# Patient Record
Sex: Female | Born: 2000 | Race: Black or African American | Hispanic: No | Marital: Single | Smoking: Never smoker
Health system: Southern US, Community
[De-identification: ages and names within clinical notes are randomized; demographics above are authoritative.]

## PROBLEM LIST (undated history)

## (undated) DIAGNOSIS — A6000 Herpesviral infection of urogenital system, unspecified: Secondary | ICD-10-CM

## (undated) DIAGNOSIS — T8859XA Other complications of anesthesia, initial encounter: Secondary | ICD-10-CM

## (undated) DIAGNOSIS — I351 Nonrheumatic aortic (valve) insufficiency: Secondary | ICD-10-CM

## (undated) DIAGNOSIS — F909 Attention-deficit hyperactivity disorder, unspecified type: Secondary | ICD-10-CM

## (undated) DIAGNOSIS — F959 Tic disorder, unspecified: Secondary | ICD-10-CM

## (undated) DIAGNOSIS — I020 Rheumatic chorea with heart involvement: Secondary | ICD-10-CM

## (undated) HISTORY — DX: Other complications of anesthesia, initial encounter: T88.59XA

## (undated) HISTORY — DX: Tic disorder, unspecified: F95.9

## (undated) HISTORY — DX: Nonrheumatic aortic (valve) insufficiency: I35.1

## (undated) HISTORY — DX: Rheumatic chorea with heart involvement: I02.0

## (undated) HISTORY — DX: Attention-deficit hyperactivity disorder, unspecified type: F90.9

## (undated) HISTORY — DX: Herpesviral infection of urogenital system, unspecified: A60.00

---
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
## 2019-01-18 ENCOUNTER — Ambulatory Visit (HOSPITAL_COMMUNITY)
Admission: EM | Admit: 2019-01-18 | Discharge: 2019-01-18 | Disposition: A | Discharge status: Home / Self Care | Payer: Medicaid - Out of State | Attending: Family Medicine | Admitting: Family Medicine

## 2019-01-18 ENCOUNTER — Encounter (HOSPITAL_COMMUNITY): Payer: Self-pay

## 2019-01-18 DIAGNOSIS — N898 Other specified noninflammatory disorders of vagina: Secondary | ICD-10-CM | POA: Diagnosis not present

## 2019-01-18 DIAGNOSIS — Z3202 Encounter for pregnancy test, result negative: Secondary | ICD-10-CM | POA: Diagnosis not present

## 2019-01-18 LAB — POCT URINALYSIS DIP (DEVICE)
Bilirubin Urine: NEGATIVE
Glucose, UA: NEGATIVE mg/dL
Hgb urine dipstick: NEGATIVE
Ketones, ur: NEGATIVE mg/dL
Leukocytes,Ua: NEGATIVE
Nitrite: NEGATIVE
Protein, ur: NEGATIVE mg/dL
Specific Gravity, Urine: >=1.03 (ref 1.005–1.030)
Urobilinogen, UA: 0.2 mg/dL (ref 0.0–1.0)
pH: 6 (ref 5.0–8.0)

## 2019-01-18 LAB — POCT PREGNANCY, URINE: Preg Test, Ur: NEGATIVE

## 2019-01-18 MED ORDER — CEFTRIAXONE SODIUM 250 MG IJ SOLR
INTRAMUSCULAR | Status: AC
  Filled 2019-01-18: qty 250

## 2019-01-18 MED ORDER — AZITHROMYCIN 250 MG PO TABS
ORAL_TABLET | ORAL | Status: AC
  Filled 2019-01-18: qty 4

## 2019-01-18 MED ORDER — CEFTRIAXONE SODIUM 250 MG IJ SOLR
250.0000 mg | Freq: Once | INTRAMUSCULAR | Status: AC
  Administered 2019-01-18: 250 mg via INTRAMUSCULAR

## 2019-01-18 MED ORDER — AZITHROMYCIN 250 MG PO TABS
1000.0000 mg | ORAL_TABLET | Freq: Once | ORAL | Status: AC
  Administered 2019-01-18: 1000 mg via ORAL

## 2019-01-18 NOTE — ED Provider Notes (Signed)
[facility] CARE CENTER   [id number] 01/18/19 Arrival Time: 1017  ASSESSMENT & PLAN:  1. Vaginal discharge     Declines HIV/RPR testing. No s/s of PID. U/A normal.   Discharge Instructions     You have been given the following medications today for treatment of suspected gonorrhea and/or chlamydia:  cefTRIAXone (ROCEPHIN) injection 250 mg azithromycin (ZITHROMAX) tablet 1,000 mg  Even though we have treated you today, we have sent testing for sexually transmitted infections. We will notify you of any positive results once they are received. If required, we will prescribe any medications you might need.  Please refrain from all sexual activity for at least the next seven days.     Pending: Labs Reviewed  POC URINE PREG, ED  POCT URINALYSIS DIP (DEVICE)  POCT PREGNANCY, URINE  CERVICOVAGINAL ANCILLARY ONLY    Will notify of any positive results. Instructed to refrain from sexual activity for at least seven days.  Reviewed expectations re: course of current medical issues. Questions answered. Outlined signs and symptoms indicating need for more acute intervention. Patient verbalized understanding. After Visit Summary given.   SUBJECTIVE:  Judy Mason is a 18 y.o. female who presents with complaint of vaginal discharge. Onset gradual. First noticed 2-3 d ago. Describes discharge as thin and white. Denies: urinary frequency, hematuria, urinary hesitancy, urinary retention, chills and sweats. Afebrile. No abdominal or pelvic pain. Normal PO intake wihout n/v. No rashes or lesions. Reports that she is sexually active with single female partner without regular condom use. OTC treatment: none. History of STI: "think so"; questions how long ago.  ROS: As per HPI. All other systems negative.   OBJECTIVE:  Vitals:   01/18/19 1105  BP: (!) 130/91  Pulse: 98  Resp: 16  Temp: 98.3 F (36.8 C)  TempSrc: Oral  SpO2: 97%     General appearance: alert, cooperative,  appears stated age and no distress Throat: lips, mucosa, and tongue normal; teeth and gums normal CV: RRR Lungs: CTAB Back: no CVA tenderness; FROM at waist Abdomen: soft, non-tender GU: deferred Skin: warm and dry Psychological: alert and cooperative; normal mood and affect.  Results for orders placed or performed during the hospital encounter of 01/18/19  POCT urinalysis dip (device)  Result Value Ref Range   Glucose, UA NEGATIVE NEGATIVE mg/dL   Bilirubin Urine NEGATIVE NEGATIVE   Ketones, ur NEGATIVE NEGATIVE mg/dL   Specific Gravity, Urine >=1.030 1.005 - 1.030   Hgb urine dipstick NEGATIVE NEGATIVE   pH 6.0 5.0 - 8.0   Protein, ur NEGATIVE NEGATIVE mg/dL   Urobilinogen, UA 0.2 0.0 - 1.0 mg/dL   Nitrite NEGATIVE NEGATIVE   Leukocytes,Ua NEGATIVE NEGATIVE  Pregnancy, urine POC  Result Value Ref Range   Preg Test, Ur NEGATIVE NEGATIVE    Labs Reviewed  POC URINE PREG, ED  POCT URINALYSIS DIP (DEVICE)  POCT PREGNANCY, URINE  CERVICOVAGINAL ANCILLARY ONLY    No Known Allergies   Family History  Problem Relation Age of Onset  . Healthy Mother   . Healthy Father    Social History   Socioeconomic History  . Marital status: Single    Spouse name: Not on file  . Number of children: Not on file  . Years of education: Not on file  . Highest education level: Not on file  Occupational History  . Not on file  Social Needs  . Financial resource strain: Not on file  . Food insecurity    Worry: Not on file  Inability: Not on file  . Transportation needs    Medical: Not on file    Non-medical: Not on file  Tobacco Use  . Smoking status: Never Smoker  . Smokeless tobacco: Never Used  Substance and Sexual Activity  . Alcohol use: Not on file  . Drug use: Not on file  . Sexual activity: Not on file  Lifestyle  . Physical activity    Days per week: Not on file    Minutes per session: Not on file  . Stress: Not on file  Relationships  . Social Product manager on phone: Not on file    Gets together: Not on file    Attends religious service: Not on file    Active member of club or organization: Not on file    Attends meetings of clubs or organizations: Not on file    Relationship status: Not on file  . Intimate partner violence    Fear of current or ex partner: Not on file    Emotionally abused: Not on file    Physically abused: Not on file    Forced sexual activity: Not on file  Other Topics Concern  . Not on file  Social History Narrative  . Not on file          Vanessa Kick, MD 01/18/19 1123

## 2019-01-18 NOTE — ED Triage Notes (Signed)
Patient report she is having white vaginal discharged after she had unprotected sexual intercourse 2 weeks ago.

## 2019-01-18 NOTE — Discharge Instructions (Signed)

## 2019-01-19 LAB — CERVICOVAGINAL ANCILLARY ONLY
Bacterial Vaginitis (gardnerella): POSITIVE — AB
Candida Glabrata: NEGATIVE
Candida Vaginitis: POSITIVE — AB
Chlamydia: NEGATIVE
Molecular Disclaimer: NEGATIVE
Molecular Disclaimer: NEGATIVE
Molecular Disclaimer: NEGATIVE
Molecular Disclaimer: NEGATIVE
Molecular Disclaimer: NORMAL
Molecular Disclaimer: NORMAL
Neisseria Gonorrhea: NEGATIVE
Trichomonas: NEGATIVE

## 2019-01-20 ENCOUNTER — Telehealth (HOSPITAL_COMMUNITY): Payer: Self-pay | Admitting: Emergency Medicine

## 2019-01-20 MED ORDER — FLUCONAZOLE 150 MG PO TABS: 150.0000 mg | ORAL_TABLET | Freq: Once | ORAL | 0 refills | Status: AC

## 2019-01-20 MED ORDER — METRONIDAZOLE 500 MG PO TABS: 500.0000 mg | ORAL_TABLET | Freq: Two times a day (BID) | ORAL | 0 refills | Status: AC

## 2019-01-20 NOTE — Telephone Encounter (Signed)
Bacterial vaginosis is positive. This was not treated at the urgent care visit.  Flagyl 500 mg BID x 7 days #14 no refills sent to patients pharmacy of choice.    Test for candida (yeast) was positive.  Prescription for fluconazole 150mg po now, repeat dose in 3d if needed, #2 no refills, sent to the pharmacy of record.  Recheck or followup with PCP for further evaluation if symptoms are not improving.    Patient contacted and made aware of    results, all questions answered   

## 2021-12-23 ENCOUNTER — Telehealth: Payer: Self-pay | Admitting: Family Medicine

## 2021-12-23 NOTE — Telephone Encounter (Signed)
Patient would like a RX for, nausea

## 2021-12-24 NOTE — Telephone Encounter (Signed)
Attempted to call pt. No voicemail set up

## 2021-12-25 NOTE — Telephone Encounter (Signed)
Called pt; call cannot be completed. Pt may follow up at nurse visit tomorrow.

## 2021-12-26 ENCOUNTER — Ambulatory Visit (INDEPENDENT_AMBULATORY_CARE_PROVIDER_SITE_OTHER): Payer: Medicaid - Out of State | Admitting: Family Medicine

## 2021-12-26 ENCOUNTER — Other Ambulatory Visit: Payer: Self-pay

## 2021-12-26 ENCOUNTER — Encounter: Payer: Self-pay | Admitting: Family Medicine

## 2021-12-26 VITALS — BP 115/79 | HR 98 | Ht 63.0 in | Wt 250.6 lb

## 2021-12-26 DIAGNOSIS — O219 Vomiting of pregnancy, unspecified: Secondary | ICD-10-CM

## 2021-12-26 DIAGNOSIS — Z3201 Encounter for pregnancy test, result positive: Secondary | ICD-10-CM | POA: Diagnosis not present

## 2021-12-26 LAB — POCT PREGNANCY, URINE: Preg Test, Ur: POSITIVE — AB

## 2021-12-26 MED ORDER — PROMETHAZINE HCL 25 MG PO TABS: 25.0000 mg | ORAL_TABLET | Freq: Four times a day (QID) | ORAL | 0 refills | Status: DC | PRN

## 2021-12-26 MED ORDER — PRENATAL VITAMIN 27-0.8 MG PO TABS: 1.0000 | ORAL_TABLET | Freq: Every day | ORAL | 11 refills | Status: AC

## 2021-12-26 NOTE — Progress Notes (Signed)
  History:  [name] is a 21 y.o. No obstetric history on file. who presents to clinic today with complaint of possible pregnancy.   +home upt Unsure of date of last LMP Had last baby at [facility] in [place], [place] Cesarean after 3 days of IOL for post dates Was not told of any problems at that time Endorses significant nausea  No past medical history on file.  No past surgical history on file.  The following portions of the patient's history were reviewed and updated as appropriate: allergies, current medications, past family history, past medical history, past social history, past surgical history and problem list.   Review of Systems:  Pertinent items noted in HPI and remainder of comprehensive ROS otherwise negative.  Objective:  Physical Exam BP 115/79   Pulse 98   Ht 5\' 3"  (1.6 m)   Wt 250 lb 9.6 oz (113.7 kg)   BMI 44.39 kg/m  Physical Exam Vitals reviewed.  Constitutional:      General: She is not in acute distress.    Appearance: She is well-developed. She is not diaphoretic.  Eyes:     General: No scleral icterus. Pulmonary:     Effort: Pulmonary effort is normal. No respiratory distress.  Skin:    General: Skin is warm and dry.  Neurological:     Mental Status: She is alert.     Coordination: Coordination normal.      Labs and Imaging Results for orders placed or performed in visit on 12/26/21 (from the past 24 hour(s))  Pregnancy, urine POC     Status: Abnormal   Collection Time: 12/26/21  8:50 AM  Result Value Ref Range   Preg Test, Ur POSITIVE (A) NEGATIVE    No results found.   Assessment & Plan:  1. Positive pregnancy test Unplanned but welcome pregnancy Will do ROI for op note from 02/25/22 Phenergan sent for nausea Dating [place] ordered given uncertainty over date of LMP To start prenatal care once dates are established - Prenatal Vit-Fe Fumarate-FA (PRENATAL VITAMIN) 27-0.8 MG TABS; Take 1 tablet by mouth daily.  Dispense: 30  tablet; Refill: 11  2. Nausea and vomiting in pregnancy  - promethazine (PHENERGAN) 25 MG tablet; Take 1 tablet (25 mg total) by mouth every 6 (six) hours as needed for nausea or vomiting.  Dispense: 30 tablet; Refill: 0    Approximately 15 minutes of total time was spent with this patient on history taking, coordination of care, education and documentation.   [place], MD 12/26/2021 9:39 AM

## 2021-12-26 NOTE — Progress Notes (Addendum)
Here for pregnancy test which was positive. States  had a period last month near end of month but not sure exactly which date. States not planned pregnancy. Informed her we recommend dating [place] when unsure LMP. Dating [place] scheduled for 01/06/22. Explained we recommend she start prenatal care once she has EDD. Recommend she start PNV. She asked we send in PNV and nausea med RX. Dr. Crissie Reese in to greet patient. RX for PNV and Phenergan sent to pharmacy.  Nancy Fetter

## 2022-01-06 ENCOUNTER — Other Ambulatory Visit: Payer: Self-pay | Admitting: Obstetrics and Gynecology

## 2022-01-06 ENCOUNTER — Other Ambulatory Visit: Payer: Self-pay

## 2022-01-06 ENCOUNTER — Ambulatory Visit (INDEPENDENT_AMBULATORY_CARE_PROVIDER_SITE_OTHER): Payer: Self-pay

## 2022-01-06 ENCOUNTER — Ambulatory Visit (INDEPENDENT_AMBULATORY_CARE_PROVIDER_SITE_OTHER): Payer: Self-pay | Admitting: Advanced Practice Midwife

## 2022-01-06 ENCOUNTER — Encounter: Payer: Self-pay | Admitting: Advanced Practice Midwife

## 2022-01-06 DIAGNOSIS — Z3491 Encounter for supervision of normal pregnancy, unspecified, first trimester: Secondary | ICD-10-CM

## 2022-01-06 DIAGNOSIS — O219 Vomiting of pregnancy, unspecified: Secondary | ICD-10-CM

## 2022-01-06 DIAGNOSIS — Z3490 Encounter for supervision of normal pregnancy, unspecified, unspecified trimester: Secondary | ICD-10-CM

## 2022-01-06 MED ORDER — CONCEPT OB 130-92.4-1 MG PO CAPS: 1.0000 | ORAL_CAPSULE | Freq: Every day | ORAL | 12 refills | Status: DC

## 2022-01-06 NOTE — Progress Notes (Unsigned)
Ultrasounds Results Note  SUBJECTIVE HPI:  [name] is a 21 y.o. G1P0 at unknown GA, unknown LMP who presents to [facility] for dating ultrasound results. The patient denies/reports abdominal pain or vaginal bleeding.   Previous HCG's none  Previous US None     Repeat ultrasound was performed earlier today.   No past medical history on file. No past surgical history on file. Social History   Socioeconomic History   Marital status: Single    Spouse name: Not on file   Number of children: Not on file   Years of education: Not on file   Highest education level: Not on file  Occupational History   Not on file  Tobacco Use   Smoking status: Never   Smokeless tobacco: Never  Substance and Sexual Activity   Alcohol use: Not on file   Drug use: Not on file   Sexual activity: Not on file  Other Topics Concern   Not on file  Social History Narrative   Not on file   Social Determinants of Health   Financial Resource Strain: Not on file  Food Insecurity: Not on file  Transportation Needs: Not on file  Physical Activity: Not on file  Stress: Not on file  Social Connections: Not on file  Intimate Partner Violence: Not on file   Current Outpatient Medications on File Prior to Visit  Medication Sig Dispense Refill   Prenatal Vit-Fe Fumarate-FA (PRENATAL VITAMIN) 27-0.8 MG TABS Take 1 tablet by mouth daily. 30 tablet 11   promethazine (PHENERGAN) 25 MG tablet Take 1 tablet (25 mg total) by mouth every 6 (six) hours as needed for nausea or vomiting. 30 tablet 0   No current facility-administered medications on file prior to visit.   No Known Allergies  I have reviewed patient's Past Medical Hx, Surgical Hx, Family Hx, Social Hx, medications and allergies.   Review of Systems Review of Systems  Constitutional: Negative for fever and chills.  Gastrointestinal: Negative for abdominal pain.  Genitourinary: Negative for vaginal bleeding.   Musculoskeletal: Negative for back pain.  Neurological: Negative for dizziness and weakness.    Physical Exam  There were no vitals taken for this visit.  No LMP recorded. Patient is pregnant. GENERAL: Well-developed, well-nourished female in no acute distress.  HEENT: Normocephalic, atraumatic.   LUNGS: Effort normal ABDOMEN: Deferred HEART: Regular rate  SKIN: Warm, dry and without erythema PSYCH: Normal mood and affect NEURO: Alert and oriented x 4  LAB RESULTS No results found for this or any previous visit (from the past 24 hour(s)).  IMAGING 10.1 week live [place] 1. Intrauterine pregnancy   2. Pregnancy with uncertain dates in first trimester     PLAN Start prenatal care with provider of your choice List of Ob/Gyn providers given. EDD based on today's [place] Patient advised to start/continue taking prenatal vitamins Go to MAU as needed for heavy bleeding, abdominal pain or fever greater than 100.4.  [place], [place] 01/06/2022 11:53 AM

## 2022-01-06 NOTE — Patient Instructions (Addendum)
[place] [facility] [employer] at [facility]             [address], [place], [place] [address] [place] [facility] [employer] [facility] at [place], [place], [place], [place], [address] [phone]  Center for [facility] at [place], [place], [place], [place], [address] [phone]  Center for [facility] at [facility] [address], [place], [place], [place], [address] [phone]  Center for [place] at [facility]                                 [place], [place], [place], [address] [phone]  Center for [facility] at [facility]                                    [address] [place], [place], [address] [place] [facility] [place] at [facility] [address] [place], [place], [place], [address]                              [facility] [place], [place], [place], [place], [address] [phone]  [facility]         Phone: [phone]  [facility]      Phone: [phone]   [facility]      Phone: [place] [facility]-Family Planning         Phone: [phone]   [place] [facility]-Maternity    Phone: [phone]  [place]      Phone: [phone]  [facility]     Phone: [phone]  [facility]        Phone: [phone]  [facility] ([address] [place]) [phone]  [facility]      Phone: [phone]   CenteringPregnancy is a model of care that started 30 years ago and is used in about 600 practices around the [place]. You meet with a group of 8-12 women due around the same time  as you. In [facility] you will have individual time with the provider and meet as a group. There's much more time for discussion and learning. You will actually have much more time with your provider in Centering than in traditional prenatal care.? You will come directly into the [facility] room and will not wait in the lobby so there is no wasted time. You will have 2-hour visits every 4 weeks then every 2 weeks. You will know your [facility] prenatal appointments in advance. In your last month of pregnancy, you may also come in for some individual visits.  Additional appointments can be scheduled if you need more care. Studies have shown that CenteringPregnancy improves birth outcomes. We have seen especially big improvements in fewer Black women delivering babies who are too small or born too early. Visit the website www.centeringhealthcare.org for more information.  

## 2022-02-04 ENCOUNTER — Emergency Department (HOSPITAL_COMMUNITY)
Admission: EM | Admit: 2022-02-04 | Discharge: 2022-02-04 | Disposition: A | Discharge status: Home / Self Care | Payer: Medicaid - Out of State | Attending: Emergency Medicine | Admitting: Emergency Medicine

## 2022-02-04 ENCOUNTER — Encounter (HOSPITAL_COMMUNITY): Payer: Self-pay

## 2022-02-04 ENCOUNTER — Other Ambulatory Visit: Payer: Self-pay

## 2022-02-04 DIAGNOSIS — Z3A01 Less than 8 weeks gestation of pregnancy: Secondary | ICD-10-CM | POA: Insufficient documentation

## 2022-02-04 DIAGNOSIS — Y92410 Unspecified street and highway as the place of occurrence of the external cause: Secondary | ICD-10-CM | POA: Diagnosis not present

## 2022-02-04 DIAGNOSIS — M545 Low back pain, unspecified: Secondary | ICD-10-CM | POA: Insufficient documentation

## 2022-02-04 DIAGNOSIS — O26891 Other specified pregnancy related conditions, first trimester: Secondary | ICD-10-CM | POA: Insufficient documentation

## 2022-02-04 MED ORDER — METRONIDAZOLE 500 MG PO TABS: 500.0000 mg | ORAL_TABLET | Freq: Two times a day (BID) | ORAL | 0 refills | Status: DC

## 2022-02-04 MED ORDER — METRONIDAZOLE 500 MG PO TABS
500.0000 mg | ORAL_TABLET | Freq: Once | ORAL | Status: AC
  Administered 2022-02-04: 500 mg via ORAL
  Filled 2022-02-04: qty 1

## 2022-02-04 MED ORDER — ACETAMINOPHEN 500 MG PO TABS
500.0000 mg | ORAL_TABLET | Freq: Once | ORAL | Status: AC
  Administered 2022-02-04: 500 mg via ORAL
  Filled 2022-02-04: qty 1

## 2022-02-04 NOTE — Discharge Instructions (Signed)
As discussed, your evaluation today has been largely reassuring.  But, it is important that you monitor your condition carefully, and do not hesitate to return to the ED if you develop new, or concerning changes in your condition. ? ?Otherwise, please follow-up with your physician for appropriate ongoing care. ? ?
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
## 2022-02-12 ENCOUNTER — Telehealth (INDEPENDENT_AMBULATORY_CARE_PROVIDER_SITE_OTHER): Payer: Medicaid - Out of State | Admitting: *Deleted

## 2022-02-12 ENCOUNTER — Encounter: Payer: Self-pay | Admitting: *Deleted

## 2022-02-12 DIAGNOSIS — I351 Nonrheumatic aortic (valve) insufficiency: Secondary | ICD-10-CM

## 2022-02-12 DIAGNOSIS — O99340 Other mental disorders complicating pregnancy, unspecified trimester: Secondary | ICD-10-CM

## 2022-02-12 DIAGNOSIS — F32A Depression, unspecified: Secondary | ICD-10-CM

## 2022-02-12 DIAGNOSIS — O99210 Obesity complicating pregnancy, unspecified trimester: Secondary | ICD-10-CM

## 2022-02-12 DIAGNOSIS — Z3490 Encounter for supervision of normal pregnancy, unspecified, unspecified trimester: Secondary | ICD-10-CM

## 2022-02-12 DIAGNOSIS — I020 Rheumatic chorea with heart involvement: Secondary | ICD-10-CM

## 2022-02-12 DIAGNOSIS — F419 Anxiety disorder, unspecified: Secondary | ICD-10-CM

## 2022-02-12 DIAGNOSIS — O0990 Supervision of high risk pregnancy, unspecified, unspecified trimester: Secondary | ICD-10-CM

## 2022-02-12 NOTE — Patient Instructions (Addendum)
  At our [facility], we work as an integrated team, providing care to address both physical and emotional health. Your medical provider may refer you to see our Behavioral Health Clinician (BHC) on the same day you see your medical provider, as availability permits; often scheduled virtually at your convenience.  Our BHC is available to all patients, visits generally last between 20-30 minutes, but can be longer or shorter, depending on patient need. The BHC offers help with stress management, coping with symptoms of depression and anxiety, major life changes , sleep issues, changing risky behavior, grief and loss, life stress, working on personal life goals, and  behavioral health issues, as these all affect your overall health and wellness.  The BHC is NOT available for the following: FMLA paperwork, court-ordered evaluations, specialty assessments (custody or disability), letters to employers, or obtaining certification for an emotional support animal. The BHC does not provide long-term therapy. You have the right to refuse integrated behavioral health services, or to reschedule to see the BHC at a later date.  Confidentiality exception: If it is suspected that a child or disabled adult is being abused or neglected, we are required by law to report that to either Child Protective Services or Adult Protective Services.  If you have a diagnosis of Bipolar affective disorder, Schizophrenia, or recurrent Major depressive disorder, we will recommend that you establish care with a psychiatrist, as these are lifelong, chronic conditions, and we want your overall emotional health and medications to be more closely monitored. If you anticipate needing extended maternity leave due to mental health issues postpartum, it it recommended you inform your medical provider, so we can put in a referral to a psychiatrist as soon as possible. The BHC is unable to recommend an extended maternity leave for mental  health issues. Your medical provider or BHC may refer you to a therapist for ongoing, traditional therapy, or to a psychiatrist, for medication management, if it would benefit your overall health. Depending on your insurance, you may have a copay or be charged a deductible, depending on your insurance, to see the BHC. If you are uninsured, it is recommended that you apply for financial assistance. (Forms may be requested at the front desk for in-person visits, via MyChart, or request a form during a virtual visit).  If you see the BHC more than 6 times, you will have to complete a comprehensive clinical assessment interview with the BHC to resume integrated services.  For virtual visits with the BHC, you must be physically in the state of [place] at the time of the visit. For example, if you live in [place], you will have to do an in-person visit with the BHC, and your out-of-state insurance may not cover behavioral health services in [place]. If you are going out of the state or country for any reason, the BHC may see you virtually when you return to [place], but not while you are physically outside of [place].    [email or web address] is a website with information to help you prepare for Labor and delivery, patient information, visitor information and more.    Here is a link to the Pregnancy Navigators . Please Fill out prior to your New OB appointment.   English Link: [email or web address]  Spanish Link: [email or web address]  
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
## 2022-02-13 ENCOUNTER — Ambulatory Visit: Payer: Self-pay | Admitting: Clinical

## 2022-02-13 DIAGNOSIS — Z658 Other specified problems related to psychosocial circumstances: Secondary | ICD-10-CM

## 2022-02-13 NOTE — Patient Instructions (Signed)
Center for [facility] at [facility] for Women [place], [place] [address] [phone] (main office) [phone] (Megyn Leng's/Madison's office)  Sign up for childbirth classes/pre-register for hospital, etc, at:  [email or web address]   [employer] www.[email or web address]      BRAINSTORMING  Develop a Plan Goals: Provide a way to start conversation about your new life with a baby Assist parents in recognizing and using resources within their reach Help pave the way before birth for an easier period of transition afterwards.  Make a list of the following information to keep in a central location: Full name of Mom and Partner: _____________________________________________ 21 full name and Date of Birth: ___________________________________________ Home Address: ___________________________________________________________ ________________________________________________________________________ Home Phone: ____________________________________________________________ Parents' cell numbers: _____________________________________________________ ________________________________________________________________________ Name and contact info for OB: ______________________________________________ Name and contact info for Pediatrician:________________________________________ Contact info for Lactation Consultants: ________________________________________  REST and SLEEP *You each need at least 4-5 hours of uninterrupted sleep every day. Write specific names and contact information.* How are you going to rest in the postpartum period? While partner's home? When partner returns to work? When you both return to work? Where will your baby sleep? Who is available to help during the day? Evening? Night? Who could move in for a period to help support you? What are some ideas to help you get enough  sleep? __________________________________________________________________________________________________________________________________________________________________________________________________________________________________________ NUTRITIOUS FOOD AND DRINK *Plan for meals before your baby is born so you can have healthy food to eat during the immediate postpartum period.* Who will look after breakfast? Lunch? Dinner? List names and contact information. Brainstorm quick, healthy ideas for each meal. What can you do before baby is born to prepare meals for the postpartum period? How can others help you with meals? Which grocery stores provide online shopping and delivery? Which restaurants offer take-out or delivery options? ______________________________________________________________________________________________________________________________________________________________________________________________________________________________________________________________________________________________________________________________________________________________________________________________________  CARE FOR MOM *It's important that mom is cared for and pampered in the postpartum period. Remember, the most important ways new mothers need care are: sleep, nutrition, gentle exercise, and time off.* Who can come take care of mom during this period? Make a list of people with their contact information. List some activities that make you feel cared for, rested, and energized? Who can make sure you have opportunities to do these things? Does mom have a space of her very own within your home that's just for her? Make a "[facility]" where she can be comfortable, rest, and renew herself  daily. ______________________________________________________________________________________________________________________________________________________________________________________________________________________________________________________________________________________________________________________________________________________________________________________________________    CARE FOR AND FEEDING BABY *Knowledgeable and encouraging people will offer the best support with regard to feeding your baby.* Educate yourself and choose the best feeding option for your baby. Make a list of people who will guide, support, and be a resource for you as your care for and feed your baby. (Friends that have breastfed or are currently breastfeeding, lactation consultants, breastfeeding support groups, etc.) Consider a postpartum doula. (These websites can give you information: dona.org & BuyingShow.es) Seek out local breastfeeding resources like the breastfeeding support group at [employer] or [employer]. ______________________________________________________________________________________________________________________________________________________________________________________________________________________________________________________________________________________________________________________________________________________________________________________________________  Verner Chol AND ERRANDS Who can help with a thorough cleaning before baby is born? Make a list of people who will help with housekeeping and chores, like laundry, light cleaning, dishes, bathrooms, etc. Who can run some errands for you? What can you do to make sure you are stocked with basic supplies before baby is born? Who is going to do the  shopping? ______________________________________________________________________________________________________________________________________________________________________________________________________________________________________________________________________________________________________________________________________________________________________________________________________     Family Adjustment *Nurture yourselves.it helps parents be  more loving and allows for better bonding with their child.* What sorts of things do you and partner enjoy doing together? Which activities help you to connect and strengthen your relationship? Make a list of those things. Make a list of people whom you trust to care for your baby so you can have some time together as a couple. What types of things help partner feel connected to Mom? Make a list. What needs will partner have in order to bond with baby? Other children? Who will care for them when you go into labor and while you are in the hospital? Think about what the needs of your older children might be. Who can help you meet those needs? In what ways are you helping them prepare for bringing baby home? List some specific strategies you have for family adjustment. _______________________________________________________________________________________________________________________________________________________________________________________________________________________________________________________________________________________________________________________________________________  SUPPORT *Someone who can empathize with experiences normalizes your problems and makes them more bearable.* Make a list of other friends, neighbors, and/or co-workers you know with infants (and small children, if applicable) with whom you can connect. Make a list of local or online support groups, mom groups, etc. in which you can be  involved. ______________________________________________________________________________________________________________________________________________________________________________________________________________________________________________________________________________________________________________________________________________________________________________________________________  Childcare Plans Investigate and plan for childcare if mom is returning to work. Talk about mom's concerns about her transition back to work. Talk about partner's concerns regarding this transition.  Mental Health *Your mental health is one of the highest priorities for a pregnant or postpartum mom.* 1 in 5 women experience anxiety and/or depression from the time of conception through the first year after birth. Postpartum Mood Disorders are the #1 complication of pregnancy and childbirth and the suffering experienced by these mothers is not necessary! These illnesses are temporary and respond well to treatment, which often includes self-care, social support, talk therapy, and medication when needed. Women experiencing anxiety and depression often say things like: "I'm supposed to be happy.why do I feel so sad?", "Why can't I snap out of it?", "I'm having thoughts that scare me." There is no need to be embarrassed if you are feeling these symptoms: Overwhelmed, anxious, angry, sad, guilty, irritable, hopeless, exhausted but can't sleep You are NOT alone. You are NOT to blame. With help, you WILL be well. Where can I find help? Medical professionals such as your OB, midwife, gynecologist, family practitioner, primary care provider, pediatrician, or mental health providers; [facility] support groups: Feelings After Birth, Breastfeeding Support Group, Baby and Me Group, and Fit 4 Two exercise classes. You have permission to ask for help. It will confirm your feelings, validate your experiences,  share/learn coping strategies, and gain support and encouragement as you heal. You are important! BRAINSTORM Make a list of local resources, including resources for mom and for partner. Identify support groups. Identify people to call late at night - include names and contact info. Talk with partner about perinatal mood and anxiety disorders. Talk with your OB, midwife, and doula about baby blues and about perinatal mood and anxiety disorders. Talk with your pediatrician about perinatal mood and anxiety disorders.   Support & Sanity Savers   What do you really need?  Basics In preparing for a new baby, many expectant parents spend hours shopping for baby clothes, decorating the nursery, and deciding which car seat to buy. Yet most don't think much about what the reality of parenting a newborn will be like, and what they need to make it through that. So, here is the advice of experienced parents. We know you'll read this,  and think "they're exaggerating, I don't really need that." Just trust [place] on these, OK? Plan for all of this, and if it turns out you don't need it, come back and teach [place] how you did it!  Must-Haves (Once baby's survival needs are met, make sure you attend to your own survival needs!) Sleep An average newborn sleeps 16-18 hours per day, over 6-7 sleep periods, rarely more than three hours at a time. It is normal and healthy for a newborn to wake throughout the night... but really hard on parents!! Naps. Prioritize sleep above any responsibilities like: cleaning house, visiting friends, running errands, etc.  Sleep whenever baby sleeps. If you can't nap, at least have restful times when baby eats. The more rest you get, the more patient you will be, the more emotionally stable, and better at solving problems.  Food You may not have realized it would be difficult to eat when you have a newborn. Yet, when we talk to countless new parents, they say things like "it may be 2:00 pm  when I realize I haven't had breakfast yet." Or "every time we sit down to dinner, baby needs to eat, and my food gets cold, so I don't bother to eat it." Finger food. Before your baby is born, stock up with one months' worth of food that: 1) you can eat with one hand while holding a baby, 2) doesn't need to be prepped, 3) is good hot or cold, 4) doesn't spoil when left out for a few hours, and 5) you like to eat. Think about: nuts, dried fruit, Clif bars, pretzels, jerky, gogurt, baby carrots, apples, bananas, crackers, cheez-n-crackers, string cheese, hot pockets or frozen burritos to microwave, garden burgers and breakfast pastries to put in the toaster, yogurt drinks, etc. Restaurant Menus. Make lists of your favorite restaurants & menu items. When family/friends want to help, you can give specific information without much thought. They can either bring you the food or send gift cards for just the right meals. Freezer Meals.  Take some time to make a few meals to put in the freezer ahead of time.  Easy to freeze meals can be anything such as soup, lasagna, chicken pie, or spaghetti sauce. Set up a Meal Schedule.  Ask friends and family to sign up to bring you meals during the first few weeks of being home. (It can be passed around at baby showers!) You have no idea how helpful this will be until you are in the throes of parenting.  https://hamilton-[email or web address] is a great website to check out. Emotional Support Know who to call when you're stressed out. Parenting a newborn is very challenging work. There are times when it totally overwhelms your normal coping abilities. EVERY NEW PARENT NEEDS TO HAVE A PLAN FOR WHO TO CALL WHEN THEY JUST CAN'T COPE ANY MORE. (And it has to be someone other than the baby's other parent!) Before your baby is born, come up with at least one person you can call for support - write their phone number down and post it on the refrigerator. Anxiety & Sadness. Baby blues are normal after  pregnancy; however, there are more severe types of anxiety & sadness which can occur and should not be ignored.  They are always treatable, but you have to take the first step by reaching out for help. [facility] offers a "Mom Talk" group which meets every Tuesday from 10 am - 11 am.  This group is for new moms who  need support and connection after their babies are born.  Call [phone].  Really, Really Helpful (Plan for them! Make sure these happen often!!) Physical Support with Taking Care of Yourselves Asking friends and family. Before your baby is born, set up a schedule of people who can come and visit and help out (or ask a friend to schedule for you). Any time someone says "let me know what I can do to help," sign them up for a day. When they get there, their job is not to take care of the baby (that's your job and your joy). Their job is to take care of you!  Postpartum doulas. If you don't have anyone you can call on for support, look into postpartum doulas:  professionals at helping parents with caring for baby, caring for themselves, getting breastfeeding started, and helping with household tasks. www.padanc.org is a helpful website for learning about doulas in our area. Peer Support / Parent Groups Why: One of the greatest ideas for new parents is to be around other new parents. Parent groups give you a chance to share and listen to others who are going through the same season of life, get a sense of what is normal infant development by watching several babies learn and grow, share your stories of triumph and struggles with empathetic ears, and forgive your own mistakes when you realize all parents are learning by trial and error. Where to find: There are many places you can meet other new parents throughout our community.  [facility] offers the following classes for new moms and their little ones:  Baby and Me (Birth to Lake) and Breastfeeding Support Group. Go to  www.conehealthybaby.com or call [phone] for more information. Time for your Relationship It's easy to get so caught up in meeting baby's immediate needs that it's hard to find time to connect with your partner, and meet the needs of your relationship. It's also easy to forget what "quality time with your partner" actually looks like. If you take your baby on a date, you'd be amazed how much of your couple time is spent feeding the baby, diapering the baby, admiring the baby, and talking about the baby. Dating: Try to take time for just the two of you. Babysitter tip: Sometimes when moms are breastfeeding a newborn, they find it hard to figure out how to schedule outings around baby's unpredictable feeding schedules. Have the babysitter come for a three hour period. When she comes over, if baby has just eaten, you can leave right away, and come back in two hours. If baby hasn't fed recently, you start the date at home. Once baby gets hungry and gets a good feeding in, you can head out for the rest of your date time. Date Nights at Home: If you can't get out, at least set aside one evening a week to prioritize your relationship: whenever baby dozes off or doesn't have any immediate needs, spend a little time focusing on each other. Potential conflicts: The main relationship conflicts that come up for new parents are: issues related to sexuality, financial stresses, a feeling of an unfair division of household tasks, and conflicts in parenting styles. The more you can work on these issues before baby arrives, the better!  Fun and Frills (Don't forget these. and don't feel guilty for indulging in them!) Everyone has something in life that is a fun little treat that they do just for themselves. It may be: reading the morning paper, or going for a  daily jog, or having coffee with a friend once a week, or going to a movie on Friday nights, or fine chocolates, or bubble baths, or curling up with a good  book. Unless you do fun things for yourself every now and then, it's hard to have the energy for fun with your baby. Whatever your "special" treats are, make sure you find a way to continue to indulge in them after your baby is born. These special moments can recharge you, and allow you to return to baby with a new joy   PERINATAL MOOD DISORDERS: [place]   _________________________________________Emergency and Crisis Resources If you are an imminent risk to self or others, are experiencing intense personal distress, and/or have noticed significant changes in activities of daily living, call:  [place]: [phone]  [address], [place], [place], [address] Mobile Crisis: [place]: [phone] Or visit the following crisis centers: Local Emergency Departments [facility]: [address] [place]. Hours: 8:30AM-5PM. Insurance Accepted: Medicaid, Medicare, and Uninsured.  [facility]:  [address], [place]  Mon-Friday 8am-3pm, [phone]                                                                                  ___________ Non-Crisis Resources To identify specific providers that are covered by your insurance, contact your insurance company or local agencies:  [place]: [phone] [facility] [employer]: [place]: [phone] Postpartum Support International- Warm-line: 813-227-8814                                                      __Outpatient Therapy and Medication Management   Providers:  [facility]: [phone] Hours: 9AM-5PM  Insurance Accepted: Alben Spittle, Shane Crutch, [place], [place] [facility]): [phone] Hours: 8AM-5:30PM  nsurance Accepted: All insurances EXCEPT AARP, [place],  [place], and [place]: [phone] Hours: 8AM-8PM Insurance Accepted: Cristal Ford, Freddrick March, [place], Medicare, [phone] [facility]: [phone] Hours: 8:30AM-7PM Insurance Accepted: Cristal Ford, Medicaid, Medicare, Tricare, [employer] Counseling:  [place] Accepted:  Holland Falling, Lorella Nimrod, [employer], [place]: [phone] Hours: 9AM-5:30PM Insurance Accepted: Alben Spittle, Charlotte Crumb, and Medicaid, Medicare, [facility]:  [phone] Hours: 9am-5pm Insurance Accepted: BCBS; they do not accept Medicaid/Medicare [facility] [place]: [phone] Hours: 9am-9pm Insurance Accepted: All major insurance including Medicaid and Medicare [facility]: [phone] Hours: [place] Accepted: All insurances EXCEPT Medicaid and Medicare. [facility]: [phone]   ____________  Parenting [place]: [phone] [place]:  [place]: (support for children in the NICU and/or with special needs), [phone]   ___________                                                                 Mental Health Support Groups Mental Health Association: [phone]    _____________                                                                                  Online Resources Postpartum Support International: http://jones-berg.[email or web address]/  800-944-4PPD 2Moms Supporting Moms:  www.momssupportingmoms.net

## 2022-02-13 NOTE — BH Specialist Note (Signed)
Integrated Behavioral Health via Telemedicine Visit  02/13/2022 Olinda Diane [id number]  I reviewed patient visit with the [facility] Intern, and I concur with the treatment plan, as documented in the [facility] Intern note.   No charge for this visit due to [facility] Intern seeing patient.  Vesta Mixer, MSW, [place] for [employer] at [facility]   Number of [place] Clinician visits: 1- Initial Visit  Session Start time: [phone]   Session End time: [id number]  Total time in minutes: 29   Referring Provider: Gaylan Gerold, CNM Patient/Family location: Pt located at a family members home  [facility] Provider location: [place] All persons participating in visit: Pt and [facility] Intern Types of Service: Individual psychotherapy  I connected with Gustavo Lah via  Telephone or Video Enabled Telemedicine Application  (Video is Caregility application) and verified that I am speaking with the correct person using two identifiers. Discussed confidentiality: Yes   I discussed the limitations of telemedicine and the availability of in person appointments.  Discussed there is a possibility of technology failure and discussed alternative modes of communication if that failure occurs.  I discussed that engaging in this telemedicine visit, they consent to the provision of behavioral healthcare and the services will be billed under their insurance.  Patient and/or legal guardian expressed understanding and consented to Telemedicine visit: Yes   Presenting Concerns: Patient and/or family reports the following symptoms/concerns: Pt states having concerns about her mood, being upset and anxious most of the time. Pt also anxious about experiencing PPD again like her first pregnancy.  Duration of problem: ongoing; Severity of problem: moderate  Patient and/or Family's Strengths/Protective Factors: Social connections, Sense of  purpose, Physical Health (exercise, healthy diet, medication compliance, etc.), and Parental Resilience  Goals Addressed: Patient will:  Reduce symptoms of: anxiety, mood instability, and stress   Increase knowledge and/or ability of: coping skills, healthy habits, and stress reduction   Demonstrate ability to: Increase healthy adjustment to current life circumstances and Increase adequate support systems for patient/family  Progress towards Goals: Ongoing  Interventions: Interventions utilized:  Solution-Focused Strategies, Supportive Counseling, and Psychoeducation and/or Health Education Standardized Assessments completed: GAD-7 and PHQ 9    02/13/2022   10:28 AM 02/12/2022    2:42 PM  GAD 7 : Generalized Anxiety Score  Nervous, Anxious, on Edge 1 1  Control/stop worrying 1 3  Worry too much - different things 1 3  Trouble relaxing 0 0  Restless 0 0  Easily annoyed or irritable 1 1  Afraid - awful might happen 1 1  Total GAD 7 Score 5 9    [place] from 02/13/2022 in [facility] [place] [facility]  PHQ-9 Total Score 2        Patient and/or Family Response: Patient agrees with treatment plan.   Assessment: Patient currently experiencing stress due to mood instability and lack of access to resources.  Patient may benefit from medication management and connection to community resources. Pt states she is interested in medication management, [facility] Intern will contact CNM Gaylan Gerold to discuss further. Pt stated she received SNAP benefits, WIC and HUD recently and feels more stable. Pt will follow-up with [facility] Intern as needed.   Plan: Follow up with behavioral health clinician on : pt will call [facility] Intern again if she needs to talk. Behavioral recommendations:   - visit the [facility] [email or web address]  - Look  through [place]   [email or web address] --Continue prioritizing healthy self-care (regular meals, adequate rest; allowing practical help from supportive friends and family) until at least postpartum medical appointment Referral(s): [place] (In Clinic)  I discussed the assessment and treatment plan with the patient and/or parent/guardian. They were provided an opportunity to ask questions and all were answered. They agreed with the plan and demonstrated an understanding of the instructions.   They were advised to call back or seek an in-person evaluation if the symptoms worsen or if the condition fails to improve as anticipated.  [address] [place]

## 2022-02-13 NOTE — BH Specialist Note (Signed)
error 

## 2022-02-18 ENCOUNTER — Other Ambulatory Visit: Payer: Medicaid - Out of State

## 2022-02-24 ENCOUNTER — Encounter (HOSPITAL_COMMUNITY): Payer: Self-pay | Admitting: Obstetrics and Gynecology

## 2022-02-24 ENCOUNTER — Inpatient Hospital Stay (HOSPITAL_COMMUNITY)
Admission: AD | Admit: 2022-02-24 | Discharge: 2022-02-24 | Disposition: A | Discharge status: Home / Self Care | Payer: Medicaid - Out of State | Attending: Obstetrics and Gynecology | Admitting: Obstetrics and Gynecology

## 2022-02-24 DIAGNOSIS — L089 Local infection of the skin and subcutaneous tissue, unspecified: Secondary | ICD-10-CM | POA: Diagnosis not present

## 2022-02-24 DIAGNOSIS — O8601 Infection of obstetric surgical wound, superficial incisional site: Secondary | ICD-10-CM | POA: Diagnosis present

## 2022-02-24 DIAGNOSIS — Z3A17 17 weeks gestation of pregnancy: Secondary | ICD-10-CM | POA: Insufficient documentation

## 2022-02-24 DIAGNOSIS — O0990 Supervision of high risk pregnancy, unspecified, unspecified trimester: Secondary | ICD-10-CM

## 2022-02-24 DIAGNOSIS — Z792 Long term (current) use of antibiotics: Secondary | ICD-10-CM | POA: Diagnosis not present

## 2022-02-24 DIAGNOSIS — O900 Disruption of cesarean delivery wound: Secondary | ICD-10-CM | POA: Diagnosis not present

## 2022-02-24 MED ORDER — CEFADROXIL 500 MG PO CAPS: 500.0000 mg | ORAL_CAPSULE | Freq: Two times a day (BID) | ORAL | 0 refills | Status: DC

## 2022-02-24 NOTE — Progress Notes (Signed)
Written and verbal d/c instructions given and understanding voiced. Pt did take written instructions and was interested in info regarding abscess

## 2022-02-24 NOTE — MAU Note (Signed)
Pt stated to CNM she did not want her d/c papers so don't print them out. Pt came by EMS but waiting for a ride in her room.

## 2022-02-24 NOTE — MAU Provider Note (Signed)
Chief Complaint: C/S scar openning   Event Date/Time   First Provider Initiated Contact with Patient 02/24/22 2035        SUBJECTIVE HPI: Judy Mason is a 21 y.o. G3P1011 at 100w1d by LMP who presents to maternity admissions reporting area on center of old C/S scar which opened up and drained fluid.  Had been feeling some cramping and was rubbing abdomen, then felt fluid.. She denies vaginal bleeding, vaginal itching/burning, urinary symptoms, h/a, dizziness, n/v, or fever/chills.    Other This is a new problem. The current episode started today. Pertinent negatives include no abdominal pain, chills, fever or myalgias. Exacerbated by: palpation of area. She has tried nothing for the symptoms.   RN Note: Judy Mason is a 21 y.o. at [redacted]w[redacted]d here in MAU reporting c/s scar openning up. Had c/s 07/2017. Pt states had pain in c/s scar for a wk. Was itchy and sore. Was scratching it earlier and something burst and area started bleeding. Area felt better after that. Is no longer bleeding but just itching. No pain now but just itching of incision  Onset of complaint: 1 hour ago  Pain score: 0  Past Medical History:  Diagnosis Date   ADHD    Complication of anesthesia    itching from epidural   Genital herpes    Mild aortic insufficiency    Sydenham's chorea with heart involvement    Tic disorder    Past Surgical History:  Procedure Laterality Date   CESAREAN SECTION     Social History   Socioeconomic History   Marital status: Single    Spouse name: Not on file   Number of children: Not on file   Years of education: Not on file   Highest education level: Not on file  Occupational History   Not on file  Tobacco Use   Smoking status: Never   Smokeless tobacco: Never  Vaping Use   Vaping Use: Former  Substance and Sexual Activity   Alcohol use: Not Currently    Comment: occasionally   Drug use: Not Currently    Types: Marijuana   Sexual activity: Yes    Birth  control/protection: None  Other Topics Concern   Not on file  Social History Narrative   Not on file   Social Determinants of Health   Financial Resource Strain: Not on file  Food Insecurity: Food Insecurity Present (02/12/2022)   Hunger Vital Sign    Worried About Running Out of Food in the Last Year: Sometimes true    Ran Out of Food in the Last Year: Sometimes true  Transportation Needs: No Transportation Needs (02/12/2022)   PRAPARE - Administrator, Civil Service (Medical): No    Lack of Transportation (Non-Medical): No  Physical Activity: Not on file  Stress: Not on file  Social Connections: Not on file  Intimate Partner Violence: Not on file   No current facility-administered medications on file prior to encounter.   Current Outpatient Medications on File Prior to Encounter  Medication Sig Dispense Refill   Prenatal Vit-Fe Fumarate-FA (PRENATAL VITAMIN) 27-0.8 MG TABS Take 1 tablet by mouth daily. 30 tablet 11   metroNIDAZOLE (FLAGYL) 500 MG tablet Take 1 tablet (500 mg total) by mouth 2 (two) times daily. (Patient not taking: Reported on 02/12/2022) 14 tablet 0   Prenat w/o A Vit-FeFum-FePo-FA (CONCEPT OB) 130-92.4-1 MG CAPS Take 1 tablet by mouth daily. (Patient not taking: Reported on 02/12/2022) 30 capsule 12   promethazine (PHENERGAN)  25 MG tablet Take 1 tablet (25 mg total) by mouth every 6 (six) hours as needed for nausea or vomiting. (Patient not taking: Reported on 02/12/2022) 30 tablet 0   Allergies  Allergen Reactions   Citrus Other (See Comments)    Pineapple - makes mouth itch    I have reviewed patient's Past Medical Hx, Surgical Hx, Family Hx, Social Hx, medications and allergies.   ROS:  Review of Systems  Constitutional:  Negative for chills and fever.  Gastrointestinal:  Negative for abdominal pain.  Musculoskeletal:  Negative for myalgias.   Review of Systems  Other systems negative   Physical Exam  Physical Exam Patient Vitals  for the past 24 hrs:  BP Temp Pulse Resp SpO2 Height Weight  02/24/22 2010 137/75 -- -- -- -- -- --  02/24/22 2009 -- (!) 97.5 F (36.4 C) 87 17 100 % 5\' 2"  (1.575 m) 114.8 kg   Constitutional: Well-developed, well-nourished female in no acute distress.  Cardiovascular: normal rate Respiratory: normal effort GI: Abd soft, non-tender.    There is a small 77mmx2mm area at center of incision (?small keloid) which has an opening in center like a tiny abscess, draining milky fluid.  Tender to touch. No erethema or induration.  MS: Extremities nontender, no edema, normal ROM Neurologic: Alert and oriented x 4.  GU: Neg CVAT.   FHT 167 by doppler  LAB RESULTS No results found for this or any previous visit (from the past 24 hour(s)).   IMAGING No results found.  MAU Management/MDM: I have reviewed the triage vital signs and the nursing notes.   Pertinent labs & imaging results that were available during my care of the patient were reviewed by me and considered in my medical decision making (see chart for details).      I have reviewed her medical records including past results, notes and treatments.   Area dried.  Wound not big enough to irrigate.  Dry dressing applied  Treatments in MAU included dry dressing.    ASSESSMENT Superficial skin infection on old cesarean scar  PLAN Discharge home Rx Duricef to treat possible infection Keep area clean and dry.  Have it rechecked when she goes in for labs. Infection precautions Pt stable at time of discharge. Encouraged to return here if she develops worsening of symptoms, increase in pain, fever, or other concerning symptoms.    Hansel Feinstein CNM, MSN Certified Nurse-Midwife 02/24/2022  8:35 PM

## 2022-02-24 NOTE — MAU Note (Signed)
.  Judy Mason is a 21 y.o. at [redacted]w[redacted]d here in MAU reporting c/s scar openning up. Had c/s 07/2017. Pt states had pain in c/s scar for a wk. Was itchy and sore. Was scratching it earlier and something burst and area started bleeding. Area felt better after that. Is no longer bleeding but just itching. No pain now but just itching of incision  Onset of complaint: 1 hour ago  Pain score: 0 Vitals:   02/24/22 2009 02/24/22 2010  BP:  137/75  Pulse: 87   Resp: 17   Temp: (!) 97.5 F (36.4 C)   SpO2: 100%      FHT:167 Lab orders placed from triage:  none

## 2022-03-09 ENCOUNTER — Ambulatory Visit: Payer: Medicaid - Out of State | Attending: Obstetrics and Gynecology | Admitting: *Deleted

## 2022-03-09 ENCOUNTER — Ambulatory Visit: Payer: Medicaid - Out of State

## 2022-03-09 ENCOUNTER — Ambulatory Visit (HOSPITAL_BASED_OUTPATIENT_CLINIC_OR_DEPARTMENT_OTHER): Payer: Medicaid - Out of State

## 2022-03-09 ENCOUNTER — Encounter: Payer: Self-pay | Admitting: *Deleted

## 2022-03-09 ENCOUNTER — Other Ambulatory Visit: Payer: Self-pay | Admitting: *Deleted

## 2022-03-09 VITALS — BP 127/67 | HR 95

## 2022-03-09 DIAGNOSIS — R638 Other symptoms and signs concerning food and fluid intake: Secondary | ICD-10-CM

## 2022-03-09 DIAGNOSIS — O99212 Obesity complicating pregnancy, second trimester: Secondary | ICD-10-CM | POA: Insufficient documentation

## 2022-03-09 DIAGNOSIS — E669 Obesity, unspecified: Secondary | ICD-10-CM | POA: Diagnosis not present

## 2022-03-09 DIAGNOSIS — Z3A19 19 weeks gestation of pregnancy: Secondary | ICD-10-CM | POA: Insufficient documentation

## 2022-03-09 DIAGNOSIS — O99352 Diseases of the nervous system complicating pregnancy, second trimester: Secondary | ICD-10-CM | POA: Diagnosis not present

## 2022-03-09 DIAGNOSIS — O0990 Supervision of high risk pregnancy, unspecified, unspecified trimester: Secondary | ICD-10-CM

## 2022-03-09 DIAGNOSIS — Z363 Encounter for antenatal screening for malformations: Secondary | ICD-10-CM

## 2022-03-09 DIAGNOSIS — O99210 Obesity complicating pregnancy, unspecified trimester: Secondary | ICD-10-CM

## 2022-03-09 DIAGNOSIS — O2692 Pregnancy related conditions, unspecified, second trimester: Secondary | ICD-10-CM

## 2022-03-09 DIAGNOSIS — G255 Other chorea: Secondary | ICD-10-CM | POA: Diagnosis not present

## 2022-03-09 DIAGNOSIS — O34219 Maternal care for unspecified type scar from previous cesarean delivery: Secondary | ICD-10-CM | POA: Diagnosis not present

## 2022-03-09 DIAGNOSIS — Z3490 Encounter for supervision of normal pregnancy, unspecified, unspecified trimester: Secondary | ICD-10-CM

## 2022-03-09 DIAGNOSIS — L089 Local infection of the skin and subcutaneous tissue, unspecified: Secondary | ICD-10-CM

## 2022-03-09 DIAGNOSIS — Z362 Encounter for other antenatal screening follow-up: Secondary | ICD-10-CM

## 2022-03-11 ENCOUNTER — Encounter: Payer: Self-pay | Admitting: Certified Nurse Midwife

## 2022-03-11 ENCOUNTER — Encounter: Payer: Medicaid - Out of State | Admitting: Obstetrics & Gynecology

## 2022-03-16 ENCOUNTER — Ambulatory Visit: Payer: Medicaid - Out of State | Attending: Cardiology | Admitting: Cardiology

## 2022-03-25 ENCOUNTER — Encounter: Payer: Self-pay | Admitting: *Deleted

## 2022-04-06 ENCOUNTER — Ambulatory Visit: Payer: Medicaid - Out of State

## 2022-04-06 ENCOUNTER — Ambulatory Visit: Payer: Medicaid - Out of State | Attending: Obstetrics and Gynecology

## 2022-04-08 ENCOUNTER — Telehealth: Payer: Self-pay | Admitting: Cardiology

## 2022-04-08 NOTE — Telephone Encounter (Signed)
Called pt about return mail, unable to LVMTCB. 12.20.2023 BM

## 2022-04-23 DIAGNOSIS — Z98891 History of uterine scar from previous surgery: Secondary | ICD-10-CM | POA: Insufficient documentation

## 2022-04-23 NOTE — Progress Notes (Addendum)
Pt did not show for appt. Will have office call her to bring in. Pt called and no answer. Certified letter sent.   Radene Gunning, MD Attending [place], [facility] for [facility], [place]

## 2022-04-24 ENCOUNTER — Encounter: Payer: Self-pay | Admitting: Family Medicine

## 2022-04-24 ENCOUNTER — Ambulatory Visit (INDEPENDENT_AMBULATORY_CARE_PROVIDER_SITE_OTHER): Payer: Medicaid - Out of State | Admitting: Obstetrics and Gynecology

## 2022-04-24 DIAGNOSIS — I020 Rheumatic chorea with heart involvement: Secondary | ICD-10-CM

## 2022-04-24 DIAGNOSIS — Z98891 History of uterine scar from previous surgery: Secondary | ICD-10-CM

## 2022-04-24 DIAGNOSIS — O0990 Supervision of high risk pregnancy, unspecified, unspecified trimester: Secondary | ICD-10-CM

## 2022-04-24 DIAGNOSIS — B009 Herpesviral infection, unspecified: Secondary | ICD-10-CM

## 2022-04-24 DIAGNOSIS — Z91199 Patient's noncompliance with other medical treatment and regimen due to unspecified reason: Secondary | ICD-10-CM

## 2022-04-24 DIAGNOSIS — O219 Vomiting of pregnancy, unspecified: Secondary | ICD-10-CM

## 2022-05-25 ENCOUNTER — Encounter: Payer: Self-pay | Admitting: Family Medicine

## 2022-07-07 ENCOUNTER — Encounter: Payer: Medicaid Other | Admitting: Advanced Practice Midwife
